# Patient Record
Sex: Male | Born: 1990 | Race: Black or African American | Hispanic: No | Marital: Single | Smoking: Never smoker
Health system: Southern US, Community
[De-identification: ages and names within clinical notes are randomized; demographics above are authoritative.]

## PROBLEM LIST (undated history)

## (undated) DIAGNOSIS — R569 Unspecified convulsions: Secondary | ICD-10-CM

## (undated) DIAGNOSIS — J45909 Unspecified asthma, uncomplicated: Secondary | ICD-10-CM

---
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
## 2015-07-04 ENCOUNTER — Emergency Department (HOSPITAL_COMMUNITY)
Admission: EM | Admit: 2015-07-04 | Discharge: 2015-07-04 | Disposition: A | Discharge status: Home / Self Care | Payer: Managed Care, Other (non HMO) | Attending: Emergency Medicine | Admitting: Emergency Medicine

## 2015-07-04 ENCOUNTER — Emergency Department (HOSPITAL_COMMUNITY): Payer: Managed Care, Other (non HMO)

## 2015-07-04 ENCOUNTER — Encounter (HOSPITAL_COMMUNITY): Payer: Self-pay | Admitting: Cardiology

## 2015-07-04 DIAGNOSIS — R079 Chest pain, unspecified: Secondary | ICD-10-CM | POA: Diagnosis present

## 2015-07-04 DIAGNOSIS — J45909 Unspecified asthma, uncomplicated: Secondary | ICD-10-CM | POA: Insufficient documentation

## 2015-07-04 HISTORY — DX: Unspecified asthma, uncomplicated: J45.909

## 2015-07-04 LAB — BASIC METABOLIC PANEL
Anion gap: 8 (ref 5–15)
BUN: 9 mg/dL (ref 6–20)
CALCIUM: 9.6 mg/dL (ref 8.9–10.3)
CO2: 25 mmol/L (ref 22–32)
CREATININE: 1.02 mg/dL (ref 0.61–1.24)
Chloride: 106 mmol/L (ref 101–111)
GFR calc Af Amer: >60 mL/min (ref >60)
GFR calc non Af Amer: >60 mL/min (ref >60)
GLUCOSE: 100 mg/dL — AB (ref 65–99)
Potassium: 4.3 mmol/L (ref 3.5–5.1)
Sodium: 139 mmol/L (ref 135–145)

## 2015-07-04 LAB — CBC
HCT: 46.3 % (ref 39.0–52.0)
HEMOGLOBIN: 15.6 g/dL (ref 13.0–17.0)
MCH: 29.2 pg (ref 26.0–34.0)
MCHC: 33.7 g/dL (ref 30.0–36.0)
MCV: 86.7 fL (ref 78.0–100.0)
PLATELETS: 195 10*3/uL (ref 150–400)
RBC: 5.34 MIL/uL (ref 4.22–5.81)
RDW: 13.2 % (ref 11.5–15.5)
WBC: 5.7 10*3/uL (ref 4.0–10.5)

## 2015-07-04 LAB — I-STAT TROPONIN, ED: TROPONIN I, POC: 0 ng/mL (ref 0.00–0.08)

## 2015-07-04 MED ORDER — ACETAMINOPHEN 325 MG PO TABS: 650.0000 mg | ORAL_TABLET | Freq: Once | ORAL | Status: DC

## 2015-07-04 MED ORDER — IBUPROFEN 800 MG PO TABS: 800.0000 mg | ORAL_TABLET | Freq: Once | ORAL | Status: DC

## 2015-07-04 MED ORDER — IBUPROFEN 800 MG PO TABS: 800.0000 mg | ORAL_TABLET | Freq: Three times a day (TID) | ORAL | Status: AC

## 2015-07-04 MED ORDER — IBUPROFEN 800 MG PO TABS
800.0000 mg | ORAL_TABLET | Freq: Once | ORAL | Status: AC
  Administered 2015-07-04: 800 mg via ORAL
  Filled 2015-07-04: qty 1

## 2015-07-04 NOTE — Discharge Instructions (Signed)
1. Medications: ibuprofen, usual home medications 2. Treatment: rest, drink plenty of fluids 3. Follow Up: please followup with your primary doctor for discussion of your diagnoses and further evaluation after today's visit; if you do not have a primary care doctor use the resource guide provided to find one; please return to the ER for severe pain, shortness of breath, new or worsening symptoms   Nonspecific Chest Pain It is often hard to find the cause of chest pain. There is always a chance that your pain could be related to something serious, such as a heart attack or a blood clot in your lungs. Chest pain can also be caused by conditions that are not life-threatening. If you have chest pain, it is very important to follow up with your doctor.  HOME CARE  If you were prescribed an antibiotic medicine, finish it all even if you start to feel better.  Avoid any activities that cause chest pain.  Do not use any tobacco products, including cigarettes, chewing tobacco, or electronic cigarettes. If you need help quitting, ask your doctor.  Do not drink alcohol.  Take medicines only as told by your doctor.  Keep all follow-up visits as told by your doctor. This is important. This includes any further testing if your chest pain does not go away.  Your doctor may tell you to keep your head raised (elevated) while you sleep.  Make lifestyle changes as told by your doctor. These may include:  Getting regular exercise. Ask your doctor to suggest some activities that are safe for you.  Eating a heart-healthy diet. Your doctor or a diet specialist (dietitian) can help you to learn healthy eating options.  Maintaining a healthy weight.  Managing diabetes, if necessary.  Reducing stress. GET HELP IF:  Your chest pain does not go away, even after treatment.  You have a rash with blisters on your chest.  You have a fever. GET HELP RIGHT AWAY IF:  Your chest pain is worse.  You have an  increasing cough, or you cough up blood.  You have severe belly (abdominal) pain.  You feel extremely weak.  You pass out (faint).  You have chills.  You have sudden, unexplained chest discomfort.  You have sudden, unexplained discomfort in your arms, back, neck, or jaw.  You have shortness of breath at any time.  You suddenly start to sweat, or your skin gets clammy.  You feel nauseous.  You vomit.  You suddenly feel light-headed or dizzy.  Your heart begins to beat quickly, or it feels like it is skipping beats. These symptoms may be an emergency. Do not wait to see if the symptoms will go away. Get medical help right away. Call your local emergency services (911 in the U.S[place] Do not drive yourself to the hospital.   This information is not intended to replace advice given to you by your health care provider. Make sure you discuss any questions you have with your health care provider.   Document Released: 02/13/2008 Document Revised: 09/17/2014 Document Reviewed: 04/02/2014 Elsevier Interactive Patient Education 2016 [employer]   Emergency Department Resource Guide 1) Find a Doctor and Pay Out of Pocket Although you won't have to find out who is covered by your insurance plan, it is a good idea to ask around and get recommendations. You will then need to call the office and see if the doctor you have chosen will accept you as a new patient and what types of options they offer  for patients who are self-pay. Some doctors offer discounts or will set up payment plans for their patients who do not have insurance, but you will need to ask so you aren't surprised when you get to your appointment.  2) Contact Your Local Health Department Not all health departments have doctors that can see patients for sick visits, but many do, so it is worth a call to see if yours does. If you don't know where your local health department is, you can check in your phone book. The CDC also has a  tool to help you locate your state's health department, and many state websites also have listings of all of their local health departments.  3) Find a [facility] If your illness is not likely to be very severe or complicated, you may want to try a walk in clinic. These are popping up all over the country in pharmacies, drugstores, and shopping centers. They're usually staffed by nurse practitioners or physician assistants that have been trained to treat common illnesses and complaints. They're usually fairly quick and inexpensive. However, if you have serious medical issues or chronic medical problems, these are probably not your best option.  No Primary Care Doctor: - Call Health Connect at  [phone] - they can help you locate a primary care doctor that  accepts your insurance, provides certain services, etc. - Physician Referral Service- [phone]  Chronic Pain Problems: Organization         Address  Phone   Notes  [facility]  [phone] Patients need to be referred by their primary care doctor.   Medication Assistance: Organization         Address  Phone   Notes  [facility] Medication [facility] [address] [place], [place] [address] [phone] --Must be a resident of [facility] -- Must have NO insurance coverage whatsoever (no Medicaid/ Medicare, etc.) -- The pt. MUST have a primary care doctor that directs their care regularly and follows them in the community   MedAssist  [phone]   [employer]  [phone]    Agencies that provide inexpensive medical care: Organization         Address  Phone   Notes  Reserve  [phone]   [facility]    [phone]   [facility] [place],  [phone]   [place] [address] [place] [phone]   Planned Parenthood    [phone]    [facility]    [phone]   [place] and [place] [address], [place] Phone:  [phone], Fax:  [phone] Hours of Operation:  9 am - 6 pm, M-F.  Also accepts Medicaid/Medicare and self-pay.  [facility] for [place], [address], [place] Phone: [phone], Fax: [phone]. Hours of Operation:  8:30 am - 5:30 pm, M-F.  Also accepts Medicaid and self-pay.  [facility] [place] [address], [place] Phone: [phone]   [place], [place], [place] [phone], Ext. [phone] Mondays & Thursdays: 7-9 AM.  First 15 patients are seen on a first come, first serve basis.    [place] Providers:  Organization         Address  Phone   Notes  [employer]  [address], [place] [phone] Also accepts self-pay patients.  [facility] [id number] [place], [place]  [phone]   [place], [address], [place] [phone]   [facility] [address], [place] [phone]   Lucianne Lei [address], [place]   [phone] Only accepts [place] Access [place] patients after they have their name applied to their card.   Self-Pay (no insurance) in [facility]:  Organization         Address  Phone   Notes  Sickle Cell Patients, [facility] [place] [phone]   [facility] [place] [phone]   [facility] [place]  [place], [place],  [phone]   [facility]/Dr. Osei-Bonsu  [address] [place] or [address], [place] [phone] Phone number for both [place] and [place] locations is the same.  [facility] [address] [place] [phone]   [facility] [address], [place] or [address] [phone]   [facility] [address], [place] [phone], phone; [phone], fax Sees patients 1st and 3rd Saturday of every month.  Must not qualify for public or private insurance (i.e. Medicaid, Medicare, [place] Health Choice, Veterans' Benefits)  Household income should be no more than 200% of the poverty level The clinic cannot treat you if you are pregnant or think you are pregnant  Sexually transmitted diseases are not treated at the clinic.    Dental Care: Organization         Address  Phone  Notes  [facility] [place] [phone] Accepts children up to age 62 who are enrolled in [place] or [place]; pregnant women with a Medicaid card; and children who have applied for Medicaid or [place] Health Choice, but were declined, whose parents can pay a reduced fee at time of service.  [facility] Department of [facility]  [address], [place] [phone] Accepts children up to age 102 who are enrolled in [place] or [place]; pregnant women with a Medicaid card; and children who have applied for Medicaid or [place] Health Choice, but were declined, whose parents can pay a reduced fee at time of service.  [place] [facility] PRO[facility]  [place] [phone] Patients are seen by appointment only. Walk-ins are not accepted. [place] will see patients 32 years of age and older. Monday - Tuesday (8am-5pm) Most Wednesdays (8:30-5pm) $30 per visit, cash only  [facility] Adult Dental Access PROGRAM  [address], [facility] [phone] Patients are seen by appointment only. Walk-ins are not accepted. [place] will see patients 29 years of age and older. One Wednesday  Evening (Monthly: Volunteer Based).  $30 per visit, cash only  [place]  [phone] for adults; Children under age 56, call Graduate Pediatric Dentistry at [phone]. Children aged 33-14, please call [phone] to request a pediatric application.  Dental services are provided in all areas of dental care including fillings, crowns and bridges, complete and partial dentures, implants, gum  treatment, root canals, and extractions. Preventive care is also provided. Treatment is provided to both adults and children. Patients are selected via a lottery and there is often a waiting list.   [facility] [address], [place]  [phone] [email or web address]   [facility] [address] [place], [place] [phone], Ext. [phone] Second and Fourth Thursday of each month, opens at 6:30 AM; Clinic ends at 9 AM.  Patients are seen on a first-come first-served basis, and a limited number are seen during each clinic.   [facility]  [address] Hillard Danker [place], [place] [phone]   Eligibility Requirements You must have lived in [place], [place], or [place] counties for at least the last three months.   You cannot be eligible for state or federal sponsored [employer], including [employer], [place], or [employer].   You generally cannot be eligible for healthcare insurance through your employer.    How to apply: Eligibility screenings are held every Tuesday and Wednesday afternoon from 1:00 pm until 4:00 pm. You do not need an appointment for the interview!  [facility] [address], [place], [place]   [place]  [place] Department  [place]  [phone]    Behavioral Health Resources in the Community: Intensive Outpatient Programs Organization         Address  Phone  Notes  [place]. [address] [place], [place] [phone]   [facility] Outpatient [address] [place], [place]   ADS: Alcohol [facility] Drug Svcs [address], [place], [place]   [place] [address],  [place], [place] or [phone]   Substance Abuse Resources Organization         Address  Phone  Notes  Alcohol and Drug Services  [phone]   [place]  [phone]   [place]   [place]inita Pester  [phone]   Residential & Outpatient Substance Abuse Program  [phone]   Psychological Services Organization         Address  Phone  Notes  [facility] [place]  [place]  [phone]   [place] [place] or [phone]    Mobile Crisis Teams Organization         Address  Phone  Notes  [facility], Mobile Crisis Care Unit  [phone]   [facility]  [address] [place], [place]   [place] [address] [place] [phone]    Self-Help/Support Groups Organization         Address  Phone             Notes  [place]. of [place] - variety of support groups  [place] Call for more information  Narcotics Anonymous (NA), [facility] [address], [employer] [place]  2 meetings at this location   Special educational needs teacher         Address  Phone  Notes  [facility] [place],    [place]  [place]  [address] [place], [place], [place]   [place], [place] [phone] Admissions: 8am-3pm M-F  [facility]  Center [address]    °[place], [place] [phone]   °[facility] [address],  [place], [place] [phone]   °[facility] [address]  °[place], [place] [phone]   °[facility] - Intensive Outpatient [address], [place], [place] [phone]   °[facility] [address]  °[place], [place] [phone] or [phone]   °[facility] ([facility]) [address] [place], [place] [phone] Accepts Medicaid  °[facility] [address]  °[place] [phone] Substance Abuse/Addiction Treatment  ° °[place] Behavioral Health Resources °Organization         Address  Phone  Notes  °[facility]  [phone]   °Julie Brannon, PhD [address] [place], [place]   ([phone]) [phone] or (336) [phone]   °[facility]   [address] °[place], [place] ([phone]) [phone]   °[facility] [address], [place], [place] [phone] Insurance/Medicaid/sponsorship through [facility]  °[facility] [address]                                    [place], [place] [phone] Therapy/tele-psych/case  °[facility] [address]  ° [place], [place] [phone]) [phone]    °Dr. Arfeen  ([phone]   °[facility] [place]  [employer] [place] [facility] 1) [address], [place] °2) [address], [place] °3)  [address], [place] [phone]) [phone] °([phone]) [phone] ° °([phone]) [phone]   °[place] Child Abuse Hotline (336) 342-1394 or (336) [phone] (After Hours)    ° ° ° °

## 2015-07-04 NOTE — ED Notes (Signed)
Reports left sided chest pain that started about a week ago. Reports he ate a spicy burger at [employer] then and has been having this pain since then.

## 2015-07-04 NOTE — ED Provider Notes (Signed)
CSN: [id number]     Arrival date & time 07/04/15  1030 History   First MD Initiated Contact with Patient 07/04/15 1144     Chief Complaint  Patient presents with  . Chest Pain     HPI   Kevin Osborne is a 24 y.o. male with a PMH of asthma who presents to the ED with chest pain, which has been present for approximately one week. He states his pain is left-sided and constant. He denies precipitating factors, including eating. He has tried over-the-counter pain medication and antacids for symptom relief, which have not been effective. He states he has been able to sleep at night, but has had to lie flat on his back, as lying on his left side exacerbates his pain. Denies fever, chills, cough, congestion, shortness of breath, abdominal pain, nausea, vomiting, diarrhea, constipation, dysuria, urgency, frequency, weakness, numbness, paresthesia.   Past Medical History  Diagnosis Date  . Asthma    History reviewed. No pertinent past surgical history. History reviewed. No pertinent family history. Social History  Substance Use Topics  . Smoking status: Never Smoker   . Smokeless tobacco: None  . Alcohol Use: No      Review of Systems  Constitutional: Negative for fever and chills.  HENT: Negative for congestion.   Respiratory: Negative for cough and shortness of breath.   Cardiovascular: Positive for chest pain.  Gastrointestinal: Negative for nausea, vomiting, abdominal pain, diarrhea and constipation.  Genitourinary: Negative for dysuria, urgency and frequency.  Neurological: Negative for dizziness, light-headedness and headaches.  All other systems reviewed and are negative.     Allergies  Review of patient's allergies indicates no known allergies.  Home Medications   Prior to Admission medications   Not on File    BP 150/74 mmHg  Pulse 69  Temp(Src) 98 F (36.7 C) (Oral)  Resp 17  Ht 5' 5.5" (1.664 m)  Wt 172 lb (78.019 kg)  BMI 28.18 kg/m2  SpO2 97% Physical  Exam  Constitutional: He is oriented to person, place, and time. He appears well-developed and well-nourished. No distress.  HENT:  Head: Normocephalic and atraumatic.  Right Ear: External ear normal.  Left Ear: External ear normal.  Nose: Nose normal.  Mouth/Throat: Uvula is midline, oropharynx is clear and moist and mucous membranes are normal.  Eyes: Conjunctivae, EOM and lids are normal. Pupils are equal, round, and reactive to light. Right eye exhibits no discharge. Left eye exhibits no discharge. No scleral icterus.  Neck: Normal range of motion. Neck supple.  Cardiovascular: Normal rate, regular rhythm, normal heart sounds, intact distal pulses and normal pulses.   Pulmonary/Chest: Effort normal and breath sounds normal. No respiratory distress. He has no wheezes. He has no rales. He exhibits no tenderness.  Abdominal: Soft. Normal appearance and bowel sounds are normal. He exhibits no distension and no mass. There is no tenderness. There is no rigidity, no rebound and no guarding.  Musculoskeletal: Normal range of motion. He exhibits no edema or tenderness.  Neurological: He is alert and oriented to person, place, and time.  Skin: Skin is warm, dry and intact. No rash noted. He is not diaphoretic. No erythema. No pallor.  Psychiatric: He has a normal mood and affect. His speech is normal and behavior is normal.  Nursing note and vitals reviewed.   ED Course  Procedures (including critical care time)  Labs Review Labs Reviewed  BASIC METABOLIC PANEL - Abnormal; Notable for the following:    Glucose, Bld  100 (*)    All other components within normal limits  CBC  I-STAT TROPOININ, ED    Imaging Review Dg Chest 2 View  07/04/2015  CLINICAL DATA:  Left chest pain for the past week. EXAM: CHEST  2 VIEW COMPARISON:  08/16/2008. FINDINGS: Normal sized heart. Clear lungs with normal vascularity. Minimal lower thoracic spine degenerative spur formation. IMPRESSION: No acute  abnormality. Electronically Signed   By: Beckie SaltsSteven  Reid M.D.   On: 07/04/2015 12:28     I have personally reviewed and evaluated these images and lab results as part of my medical decision-making.   EKG Interpretation   Date/Time:  Monday July 04 2015 10:40:01 EDT Ventricular Rate:  78 PR Interval:  130 QRS Duration: 96 QT Interval:  368 QTC Calculation: 419 R Axis:   113 Text Interpretation:  Normal sinus rhythm Right axis deviation Nonspecific  ST abnormality Abnormal ECG Sinus rhythm hypertrophic changes Borderline  ECG Confirmed by Gerhard MunchLOCKWOOD, ROBERT  MD ([id number]) on 07/04/2015 1:30:34 PM      MDM   Final diagnoses:  Chest pain, unspecified chest pain type    24 year old male presents with constant left-sided chest pain x 1 week.  Denies fever, chills, cough, congestion, shortness of breath, abdominal pain, nausea, vomiting, diarrhea, constipation, dysuria, urgency, frequency, weakness, numbness, paresthesia. Patient is afebrile. Vital signs stable. Heart regular rate and rhythm. Lungs clear to auscultation bilaterally. No wheezing. Abdomen soft, non-tender, non-distended. No lower extremity edema.  CBC negative for leukocytosis. BMP within normal limits. Chest x-ray negative for acute abnormality. EKG normal sinus rhythm, no acute ischemia. Troponin negative.  Patient denies personal history of hypertension or hyperlipidemia. States he does not use tobacco products or drugs. Denies family history of significant cardiac events. HEART score 0. Low suspicion for ACS. Patient denies recent travel or immobility, recent surgery, history of malignancy, history of DVT/PE. PERC negative. Doubt PE. Patient is well-appearing, feel he is stable for discharge at this time. Will discharge with ibuprofen. Patient to follow up with PCP. Return precautions discussed. Patient verbalizes his understanding and is in agreement with plan.  BP 123/76 mmHg  Pulse 69  Temp(Src) 98.9 F (37.2 C) (Oral)   Resp 17  Ht 5' 5.5" (1.664 m)  Wt 172 lb (78.019 kg)  BMI 28.18 kg/m2  SpO2 99%     Mady Gemmalizabeth C [place] Abee, PA-C 07/04/15 1554  Gerhard Munchobert Lockwood, MD 07/04/15 1601

## 2016-10-15 IMAGING — DX DG CHEST 2V
2 series · 2 of 2 positions shown · non-contrast
Comparison: 08/16/2008.

CLINICAL DATA: Left chest pain for the past week.

EXAM:
CHEST  2 VIEW

[w chest pa]
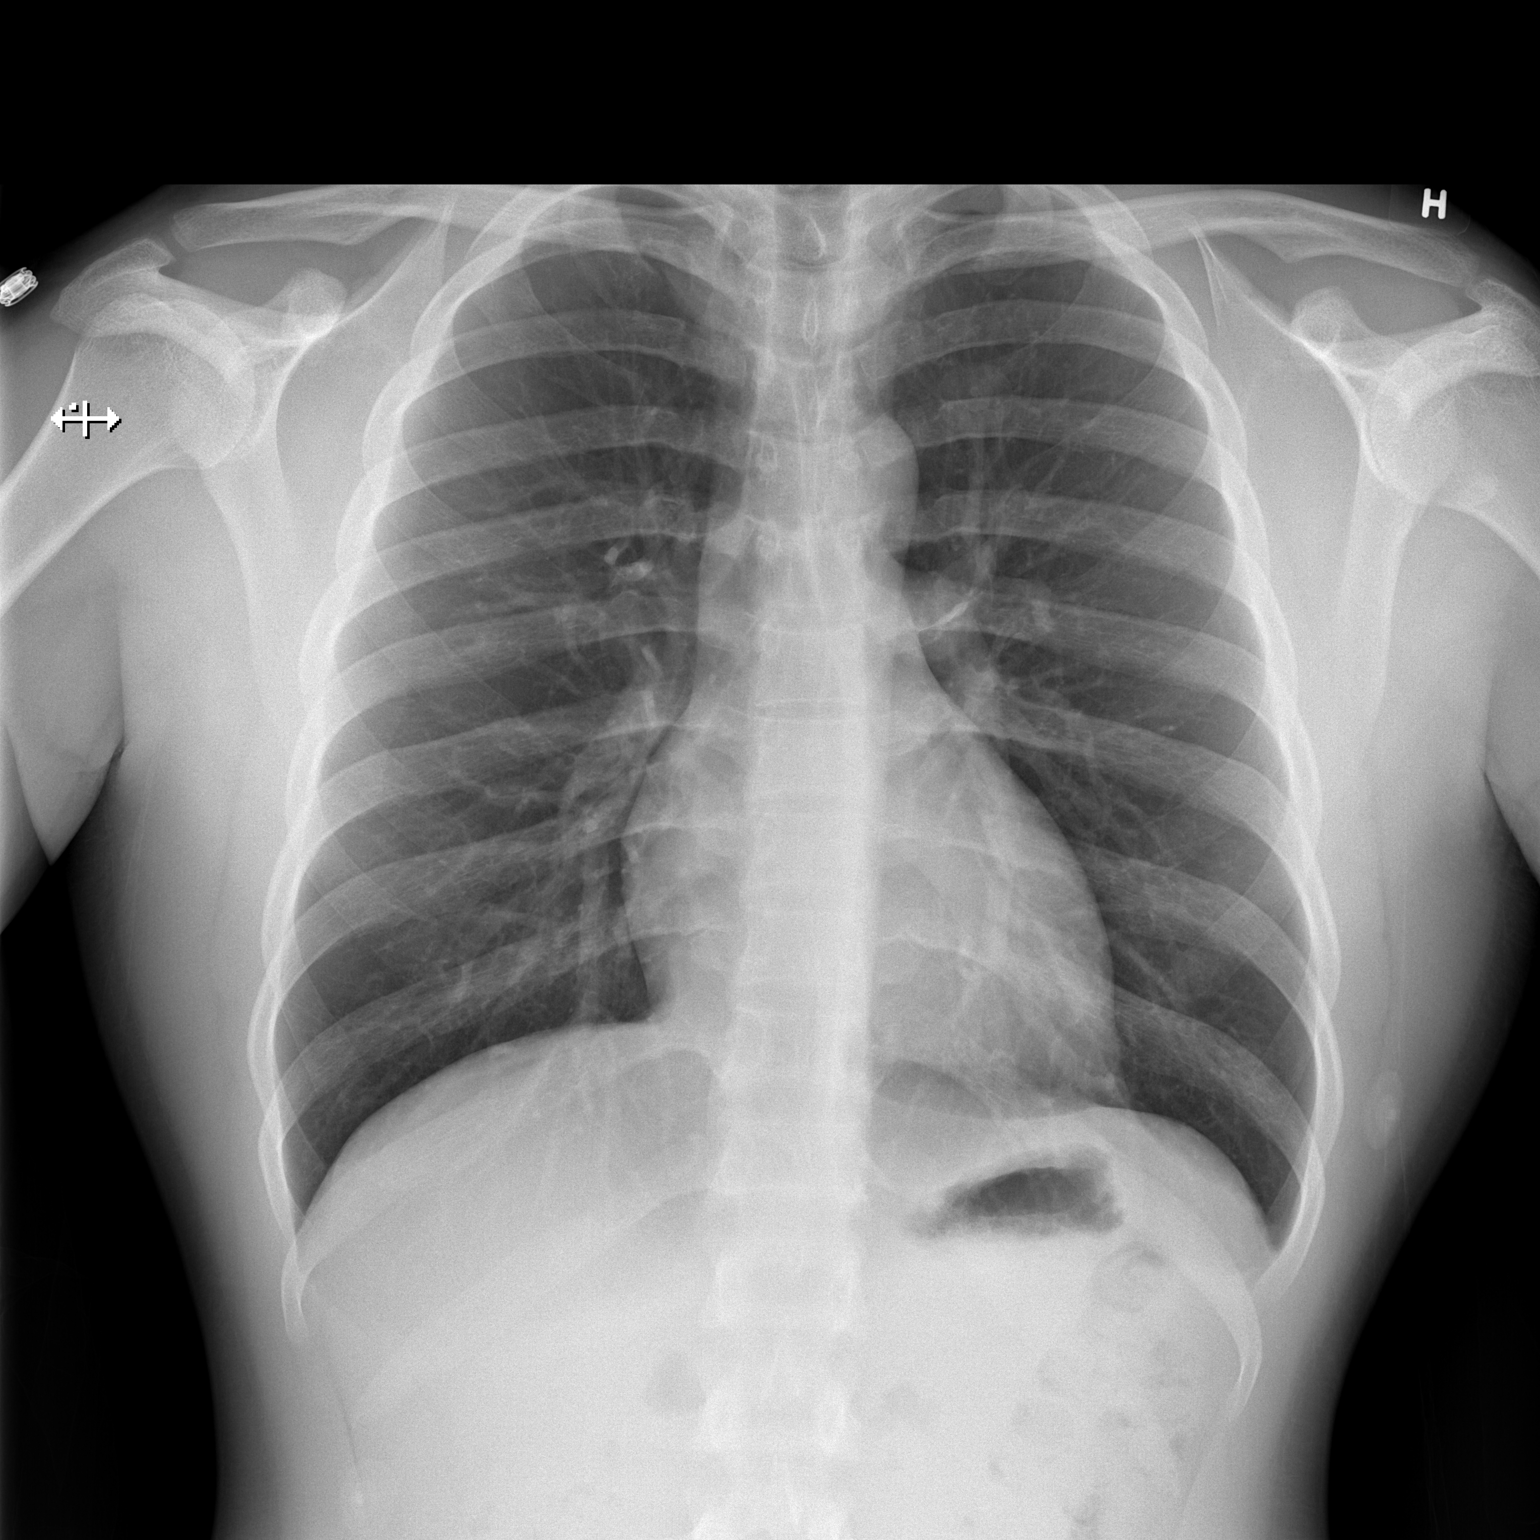

[w chest lat]
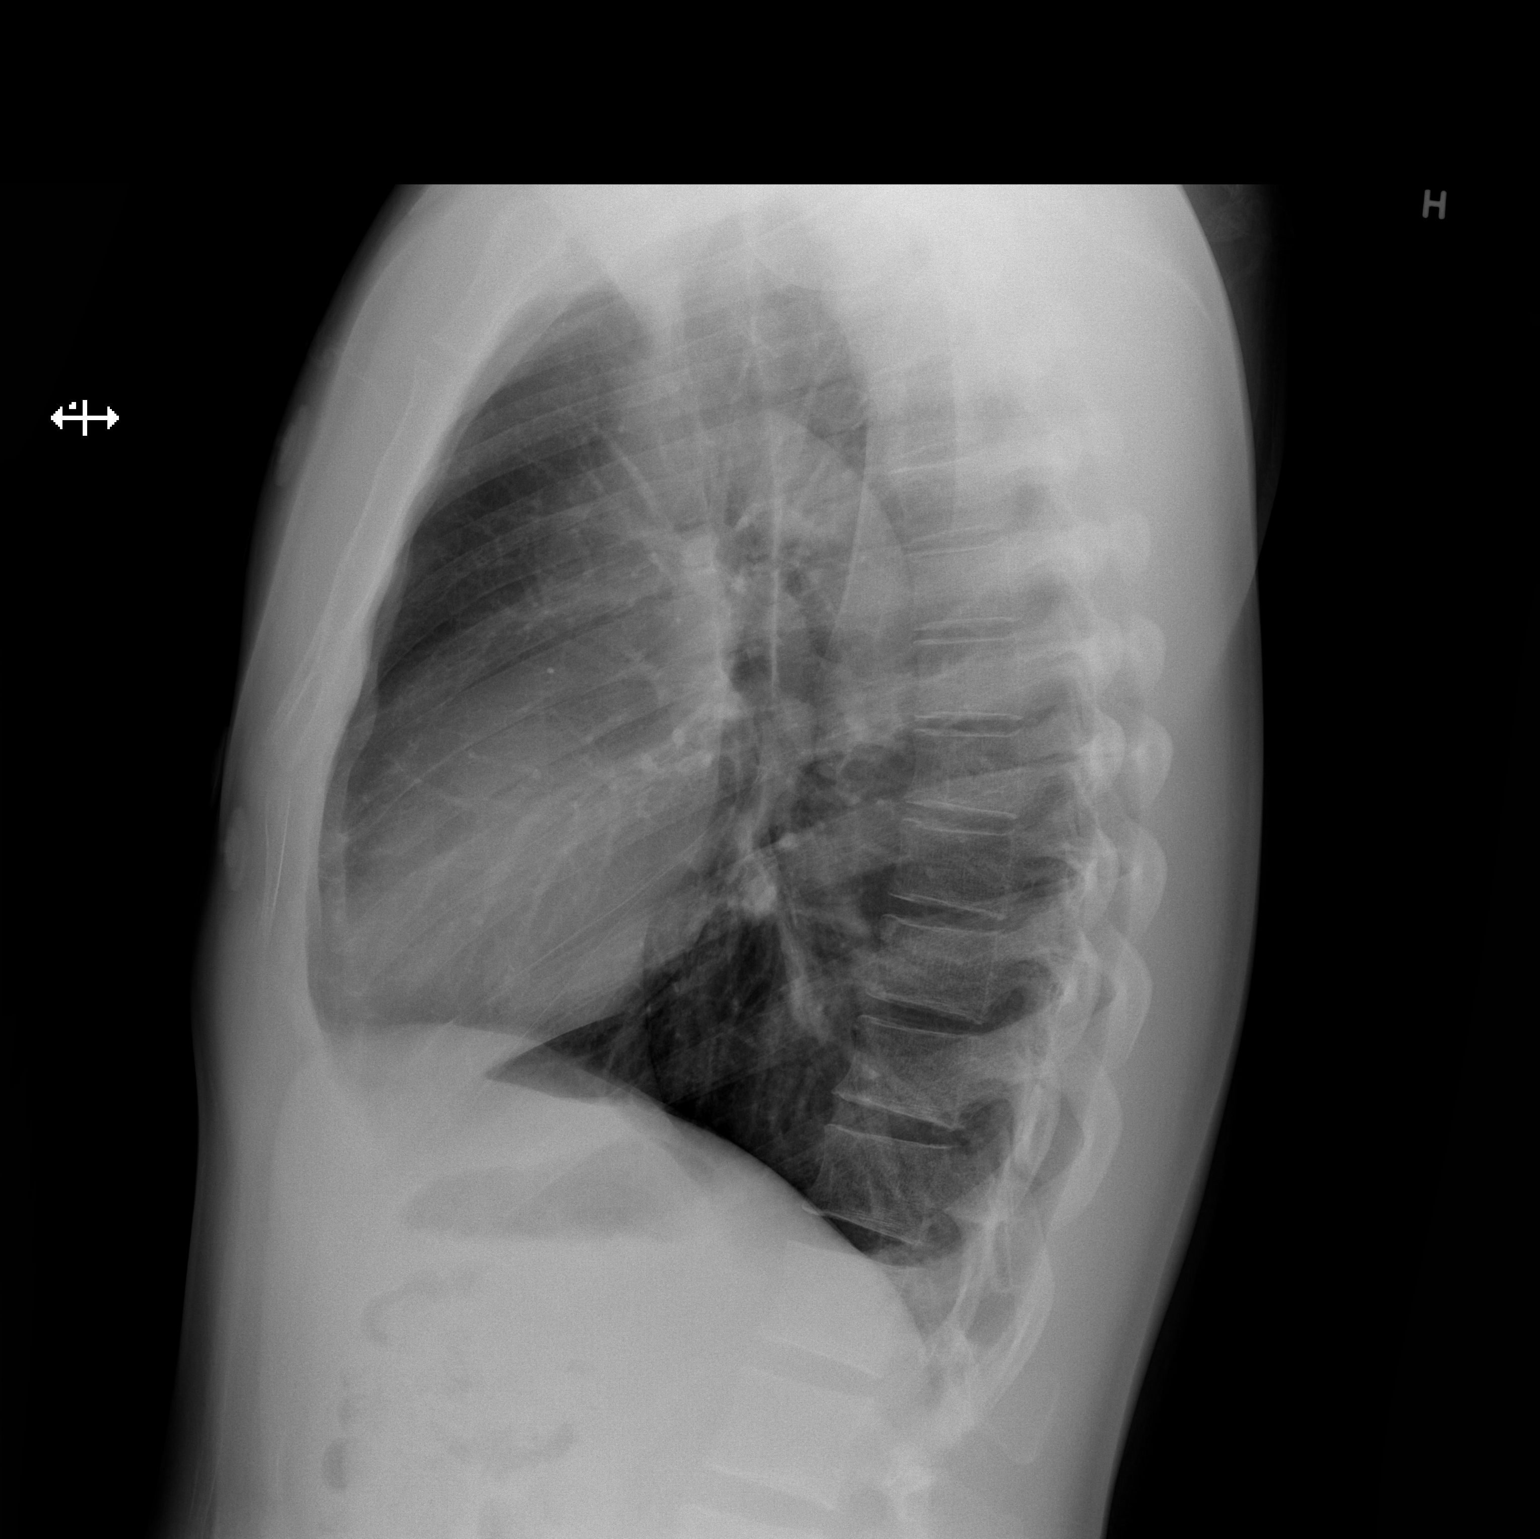

[2 of 2 positions shown; findings below may reference images not displayed]

FINDINGS: Normal sized heart. Clear lungs with normal vascularity. Minimal
lower thoracic spine degenerative spur formation.
IMPRESSION: No acute abnormality.

## 2024-01-26 ENCOUNTER — Other Ambulatory Visit: Payer: Self-pay

## 2024-01-26 ENCOUNTER — Emergency Department (HOSPITAL_COMMUNITY)
Admission: EM | Admit: 2024-01-26 | Discharge: 2024-01-26 | Disposition: A | Discharge status: Home / Self Care | Attending: Emergency Medicine | Admitting: Emergency Medicine

## 2024-01-26 ENCOUNTER — Encounter (HOSPITAL_COMMUNITY): Payer: Self-pay

## 2024-01-26 DIAGNOSIS — R7989 Other specified abnormal findings of blood chemistry: Secondary | ICD-10-CM | POA: Diagnosis not present

## 2024-01-26 DIAGNOSIS — R55 Syncope and collapse: Secondary | ICD-10-CM | POA: Diagnosis present

## 2024-01-26 HISTORY — DX: Unspecified convulsions: R56.9

## 2024-01-26 LAB — URINALYSIS, ROUTINE W REFLEX MICROSCOPIC
Bacteria, UA: NONE SEEN
Bilirubin Urine: NEGATIVE
Glucose, UA: 50 mg/dL — AB
Hgb urine dipstick: NEGATIVE
Ketones, ur: NEGATIVE mg/dL
Leukocytes,Ua: NEGATIVE
Nitrite: NEGATIVE
Protein, ur: NEGATIVE mg/dL
Specific Gravity, Urine: 1.015 (ref 1.005–1.030)
pH: 5 (ref 5.0–8.0)

## 2024-01-26 LAB — COMPREHENSIVE METABOLIC PANEL WITH GFR
ALT: 17 U/L (ref 0–44)
AST: 23 U/L (ref 15–41)
Albumin: 4.4 g/dL (ref 3.5–5.0)
Alkaline Phosphatase: 42 U/L (ref 38–126)
Anion gap: 11 (ref 5–15)
BUN: 26 mg/dL — ABNORMAL HIGH (ref 6–20)
CO2: 23 mmol/L (ref 22–32)
Calcium: 9.4 mg/dL (ref 8.9–10.3)
Chloride: 98 mmol/L (ref 98–111)
Creatinine, Ser: 1.65 mg/dL — ABNORMAL HIGH (ref 0.61–1.24)
GFR, Estimated: 57 mL/min — ABNORMAL LOW (ref >60)
Glucose, Bld: 147 mg/dL — ABNORMAL HIGH (ref 70–99)
Potassium: 3.6 mmol/L (ref 3.5–5.1)
Sodium: 132 mmol/L — ABNORMAL LOW (ref 135–145)
Total Bilirubin: 1.2 mg/dL (ref 0.0–1.2)
Total Protein: 7.9 g/dL (ref 6.5–8.1)

## 2024-01-26 LAB — TROPONIN I (HIGH SENSITIVITY)
Troponin I (High Sensitivity): 34 ng/L — ABNORMAL HIGH (ref <18)
Troponin I (High Sensitivity): 32 ng/L — ABNORMAL HIGH (ref <18)

## 2024-01-26 LAB — RAPID URINE DRUG SCREEN, HOSP PERFORMED
Amphetamines: NOT DETECTED
Barbiturates: NOT DETECTED
Benzodiazepines: NOT DETECTED
Cocaine: NOT DETECTED
Opiates: NOT DETECTED
Tetrahydrocannabinol: NOT DETECTED

## 2024-01-26 LAB — CBC WITH DIFFERENTIAL/PLATELET
Abs Immature Granulocytes: 0.01 10*3/uL (ref 0.00–0.07)
Basophils Absolute: 0.1 10*3/uL (ref 0.0–0.1)
Basophils Relative: 1 %
Eosinophils Absolute: 0.1 10*3/uL (ref 0.0–0.5)
Eosinophils Relative: 1 %
HCT: 47.8 % (ref 39.0–52.0)
Hemoglobin: 15.6 g/dL (ref 13.0–17.0)
Immature Granulocytes: 0 %
Lymphocytes Relative: 20 %
Lymphs Abs: 1.6 10*3/uL (ref 0.7–4.0)
MCH: 28.9 pg (ref 26.0–34.0)
MCHC: 32.6 g/dL (ref 30.0–36.0)
MCV: 88.5 fL (ref 80.0–100.0)
Monocytes Absolute: 0.5 10*3/uL (ref 0.1–1.0)
Monocytes Relative: 7 %
Neutro Abs: 5.6 10*3/uL (ref 1.7–7.7)
Neutrophils Relative %: 71 %
Platelets: 219 10*3/uL (ref 150–400)
RBC: 5.4 MIL/uL (ref 4.22–5.81)
RDW: 12.2 % (ref 11.5–15.5)
WBC: 7.8 10*3/uL (ref 4.0–10.5)
nRBC: 0 % (ref 0.0–0.2)

## 2024-01-26 LAB — MAGNESIUM: Magnesium: 2.2 mg/dL (ref 1.7–2.4)

## 2024-01-26 LAB — CK: Total CK: 384 U/L (ref 49–397)

## 2024-01-26 MED ORDER — SODIUM CHLORIDE 0.9 % IV BOLUS
1000.0000 mL | Freq: Once | INTRAVENOUS | Status: AC
  Administered 2024-01-26: 1000 mL via INTRAVENOUS

## 2024-01-26 NOTE — ED Provider Notes (Addendum)
 [place] [facility] AT [facility] Provider Note   CSN: [id number] Arrival date & time: 01/26/24  1645     History Chief Complaint  Patient presents with   Near Syncope    Kevin Osborne is a 33 y.o. male self reportedly otherwise healthy presents to the ER today for evaluation of near syncopal event. The patient reports that he was at brunch around 1500/1600 earlier today.  He reports that he had had some soda and a burger and fries and just finished his meal when he started to feel this overall lightheadedness with diaphoresis.  Did not have any chest pain, shortness breath, palpitations, headache, visual changes, trouble walking, or trouble talking.  Reports that his aunt had walked into a cooler part of the restaurant for him to cool down.  He reports that he went to the bathroom and had a normal bowel movement that was not melanotic or bloody.  Denies diarrhea.  Reports that he did feel better after that.  Walked back to the table but then started to have diffuse body cramps in his legs, gluteus/hips, arms. He does take workout supplements such as creatine and preworkout, none today.  He did play basketball earlier today.  He says that he does usually drink water however has not had any today.  His last workout was on Friday without any new exercises.  He denies having any chest pain, shortness of breath, palpitations, nausea, vomiting, headache, vision changes, numbness, tingling, unilateral weakness during this entirety of the event and afterwards.  Feels at his baseline health now.  No known drug allergies.  Denies any tobacco, EtOH, drug use.  The patient reports that he does have a distant history of childhood asthma and nonepileptic seizures, but has not been on any medications since 2007 and has not had any issues since then.    Near Syncope Pertinent negatives include no chest pain, no abdominal pain, no headaches and no shortness of breath.       Home  Medications Prior to Admission medications   Medication Sig Start Date End Date Taking? Authorizing Provider  albuterol (PROVENTIL HFA;VENTOLIN HFA) 108 (90 BASE) MCG/ACT inhaler Inhale 2 puffs into the lungs every 6 (six) hours as needed for wheezing or shortness of breath.    [provider]  calcium carbonate (TUMS - DOSED IN MG ELEMENTAL CALCIUM) 500 MG chewable tablet Chew 4 tablets by mouth daily.    [provider]  ibuprofen  (ADVIL ,MOTRIN ) 800 MG tablet Take 1 tablet (800 mg total) by mouth 3 (three) times daily. 07/04/15   Kriss Peter, PA-C  Multiple Vitamin (MULTIVITAMIN WITH MINERALS) TABS tablet Take 1 tablet by mouth daily.    [provider]      Allergies    Patient has no known allergies.    Review of Systems   Review of Systems  Constitutional:  Negative for chills and fever.  HENT:  Negative for congestion and rhinorrhea.   Eyes:  Negative for photophobia and visual disturbance.  Respiratory:  Negative for cough and shortness of breath.   Cardiovascular:  Positive for near-syncope. Negative for chest pain and palpitations.  Gastrointestinal:  Negative for abdominal pain, constipation, diarrhea, nausea and vomiting.  Musculoskeletal:  Positive for myalgias.  Neurological:  Positive for light-headedness. Negative for dizziness, syncope, speech difficulty, weakness and headaches.    Physical Exam Updated Vital Signs BP 132/75   Pulse 81   Temp 97.8 F (36.6 C) (Oral)   Resp  16   SpO2 98%  Physical Exam Vitals and nursing note reviewed.  Constitutional:      General: He is not in acute distress.    Appearance: He is not ill-appearing or toxic-appearing.  HENT:     Mouth/Throat:     Mouth: Mucous membranes are dry.  Eyes:     General: No scleral icterus. Cardiovascular:     Rate and Rhythm: Normal rate.     Pulses:          Radial pulses are 2+ on the right side and 2+ on the left side.       Dorsalis pedis pulses are 2+  on the right side and 2+ on the left side.       Posterior tibial pulses are 2+ on the right side and 2+ on the left side.  Pulmonary:     Effort: Pulmonary effort is normal. No respiratory distress.  Abdominal:     Palpations: Abdomen is soft.     Tenderness: There is no abdominal tenderness. There is no guarding or rebound.  Musculoskeletal:        General: No tenderness.     Right lower leg: No edema.     Left lower leg: No edema.     Comments: Compartments are soft throughout.  No specific tenderness to palpation.  Neurovascular intact distally in all 4 extremities.  Coloration, temperature, and size appear and feel symmetric bilaterally in the upper and lower extremities.  Skin:    General: Skin is warm and dry.  Neurological:     General: No focal deficit present.     Mental Status: He is alert.     GCS: GCS eye subscore is 4. GCS verbal subscore is 5. GCS motor subscore is 6.     Cranial Nerves: No cranial nerve deficit, dysarthria or facial asymmetry.     Sensory: No sensory deficit.     Motor: No weakness or pronator drift.     Coordination: Finger-Nose-Finger Test normal.     ED Results / Procedures / Treatments   Labs (all labs ordered are listed, but only abnormal results are displayed) Labs Reviewed  COMPREHENSIVE METABOLIC PANEL WITH GFR - Abnormal; Notable for the following components:      Result Value   Sodium 132 (*)    Glucose, Bld 147 (*)    BUN 26 (*)    Creatinine, Ser 1.65 (*)    GFR, Estimated 57 (*)    All other components within normal limits  TROPONIN I (HIGH SENSITIVITY) - Abnormal; Notable for the following components:   Troponin I (High Sensitivity) 34 (*)    All other components within normal limits  CBC WITH DIFFERENTIAL/PLATELET  MAGNESIUM  CK  URINALYSIS, ROUTINE W REFLEX MICROSCOPIC  RAPID URINE DRUG SCREEN, HOSP PERFORMED  TROPONIN I (HIGH SENSITIVITY)    EKG EKG Interpretation Date/Time:  Sunday Jan 26 2024 18:02:41  EDT Ventricular Rate:  87 PR Interval:  143 QRS Duration:  84 QT Interval:  347 QTC Calculation: 418 R Axis:   49  Text Interpretation: Sinus rhythm Confirmed by Dorenda Gandy [id number]) on 01/26/2024 6:39:09 PM  Radiology No results found.  Procedures Procedures   Medications Ordered in ED Medications  sodium chloride 0.9 % bolus 1,000 mL (has no administration in time range)  sodium chloride 0.9 % bolus 1,000 mL (1,000 mLs Intravenous New Bag/Given 01/26/24 1903)    ED Course/ Medical Decision Making/ A&P    Medical Decision Making Amount and/or  Complexity of Data Reviewed Labs: ordered.   33 y.o. male presents to the ER for evaluation of near syncopal event. Differential diagnosis includes but is not limited to CVA, ACS, arrhythmia, vasovagal / orthostatic hypotension, sepsis, hypoglycemia, electrolyte disturbance, respiratory failure, anemia, dehydration, heat injury, polypharmacy, malignancy, anxiety/panic attack. Vital signs unremarkable. Physical exam as noted above.   I independently reviewed and interpreted the patient's labs. Magnesium at 2.2. CK at 384.CMP shows AKI with creatinine at 1.65 and BUN at 26. Glucose 147 and sodium at 132. CBC without leukocytosis or anemia. Troponin at 34, repeat pending.   Concerned for elevated troponin. My attending is aware. He is still receiving IVF. Urinalysis in process. Will need delta troponins. Denies known early cardiac deaths <40 y/o in the family.   EKG reviewed and interpreted by my attending and read as sinus rhythm.   8:30 PM Care of MARQUAVION VENHUIZEN  transferred to [facility] at the end of my shift as the patient will require reassessment once labs/imaging have resulted. Patient presentation, ED course, and plan of care discussed with review of all pertinent labs and imaging. Please see his/her note for further details regarding further ED course and disposition. Plan at time of handoff is follow up second troponin,  consult cardiology, may need additional fluids. If cleared for discharge, would consult cardiology. This may be altered or completely changed at the discretion of the oncoming team pending results of further workup.  Portions of this report may have been transcribed using voice recognition software. Every effort was made to ensure accuracy; however, inadvertent computerized transcription errors may be present.    Final Clinical Impression(s) / ED Diagnoses Final diagnoses:  None    Rx / DC Orders ED Discharge Orders     None         Spence Dux, PA-C 01/26/24 2033    Spence Dux, PA-C 01/26/24 2108    Dorenda Gandy, MD 01/26/24 2244

## 2024-01-26 NOTE — Discharge Instructions (Signed)
 You were seen in the ER today for concerns of near syncope. Your labs and imaging were thankfully reassuring although your troponin level was elevated but this is not clear as to why. I spoke with our cardiologist regarding this and he does not feel that you need admission to the hospital at this time but should follow up with a cardiologist. For any concerns of new or worsening symptoms, return to the ER. Otherwise, follow up with the cardiologist and your primary care provider.

## 2024-01-26 NOTE — ED Notes (Signed)
 Messaged provider about patient's lab work (BUN & Creat. )

## 2024-01-26 NOTE — ED Triage Notes (Signed)
 Pt states he he had just finished eating a meal and suddenly began to feel bad like he was going to pass out. Family took pt to the bathroom and pt had a bowel movement and felt better after that, soon after that pt states he began to cramp all over where he couldn't even walk without support. Pt states he is still cramping at this moment just not as bad. Pt denies any chest pain or sob.

## 2024-01-26 NOTE — ED Provider Notes (Signed)
 Accepted handoff at shift change from [place], PA-C. Please see prior provider note for more detail.   Briefly: Patient is 33 y.o.   DDX: concern for ACS, PE, pneumonia, vasovagal syncope  Plan: disposition per repeat troponin and symptomatic improvement/stabilization  Physical Exam  BP 118/63   Pulse 67   Temp 98.3 F (36.8 C) (Oral)   Resp 17   SpO2 96%   Physical Exam Vitals and nursing note reviewed.  Constitutional:      General: He is not in acute distress.    Appearance: He is not ill-appearing or toxic-appearing.  HENT:     Mouth/Throat:     Mouth: Mucous membranes are dry.  Eyes:     General: No scleral icterus. Cardiovascular:     Rate and Rhythm: Normal rate.     Pulses:          Radial pulses are 2+ on the right side and 2+ on the left side.       Dorsalis pedis pulses are 2+ on the right side and 2+ on the left side.       Posterior tibial pulses are 2+ on the right side and 2+ on the left side.  Pulmonary:     Effort: Pulmonary effort is normal. No respiratory distress.  Abdominal:     Palpations: Abdomen is soft.     Tenderness: There is no abdominal tenderness. There is no guarding or rebound.  Musculoskeletal:        General: No tenderness.     Right lower leg: No edema.     Left lower leg: No edema.     Comments: Compartments are soft throughout.  No specific tenderness to palpation.  Neurovascular intact distally in all 4 extremities.  Coloration, temperature, and size appear and feel symmetric bilaterally in the upper and lower extremities.  Skin:    General: Skin is warm and dry.  Neurological:     General: No focal deficit present.     Mental Status: He is alert.     GCS: GCS eye subscore is 4. GCS verbal subscore is 5. GCS motor subscore is 6.     Cranial Nerves: No cranial nerve deficit, dysarthria or facial asymmetry.     Sensory: No sensory deficit.     Motor: No weakness or pronator drift.     Coordination: Finger-Nose-Finger Test normal.      Procedures  Procedures  ED Course / MDM    Medical Decision Making Amount and/or Complexity of Data Reviewed Labs: ordered.   Repeat troponin downtrending to 32 compared to initial at 34. Will consult cardiology.  Spoke with Dr. Leandrew Proctor, cardiology fellow, who reviewed patient chart and has no specific concerns that would require admission for patient at this time. Did advise that there may have been a potential arrhythmia that occurred during the near syncopal episodes that caused patient to have troponin elevations.  Informed patient of recommendations from cardiology and he is agreeable with discharge home and outpatient cardiology follow up. Has remained stable otherwise with no significant changes while in the ED. Discharged home in stable condition.       Yesly Gerety A, PA-C 01/26/24 2257    Dalene Duck, MD 01/26/24 [phone]

## 2024-02-20 DIAGNOSIS — R55 Syncope and collapse: Secondary | ICD-10-CM | POA: Insufficient documentation

## 2024-02-20 NOTE — Progress Notes (Signed)
 Cardiology Office Note   Date:  02/21/2024   ID:  Kevin Osborne, DOB 23-Mar-1991, MRN [id number]  PCP:  Patient, No Pcp Per  Cardiologist:   None Referring:  ED  No chief complaint on file.     History of Present Illness: Kevin Osborne is a 33 y.o. male who presents for evaluation of near syncope.  He has no past cardiac history.  He presented to the emergency room and I reviewed these records.  This was May 18.  He had near syncope.  He had played for vigorous games of basketball.  He then went out to dinner with his family.  He developed severe cramping in his leg and his side.  He had gotten some diaphoresis.  He had presyncope.   He did not actually lose consciousness.  In the emergency room he was found to have troponins that were minimally elevated but flat.  His creatinine was 1.65 having previously been 1.02.  He was slightly hyponatremic.  EKG was unremarkable.  He was given some hydration.  He had been using creatine and also using something called Preworkout from [employer].  I looked this up and there was caffeine and creatine in this as well.  He usually exercises very vigorously and he does not have chest pressure, neck or arm discomfort.  He has never had shortness of breath, PND or orthopnea.  He has not had any palpitations, presyncope or syncope.  He has had no prior cardiac workup or testing.   Past Medical History:  Diagnosis Date   Asthma    Seizures (HCC)     History reviewed. No pertinent surgical history.   Current Outpatient Medications  Medication Sig Dispense Refill   albuterol (PROVENTIL HFA;VENTOLIN HFA) 108 (90 BASE) MCG/ACT inhaler Inhale 2 puffs into the lungs every 6 (six) hours as needed for wheezing or shortness of breath.     ibuprofen  (ADVIL ,MOTRIN ) 800 MG tablet Take 1 tablet (800 mg total) by mouth 3 (three) times daily. 21 tablet 0   calcium carbonate (TUMS - DOSED IN MG ELEMENTAL CALCIUM) 500 MG chewable tablet Chew 4 tablets by mouth  daily. (Patient not taking: Reported on 02/21/2024)     Multiple Vitamin (MULTIVITAMIN WITH MINERALS) TABS tablet Take 1 tablet by mouth daily. (Patient not taking: Reported on 02/21/2024)     No current facility-administered medications for this visit.    Allergies:   Patient has no known allergies.    Social History:  The patient  reports that he has never smoked. He does not have any smokeless tobacco history on file. He reports that he does not drink alcohol and does not use drugs.   Family History:  The patient's family history is not on file.    ROS:  Please see the history of present illness.   Otherwise, review of systems are positive for none.   All other systems are reviewed and negative.    PHYSICAL EXAM: VS:  BP 120/62 (BP Location: Left Arm, Patient Position: Sitting, Cuff Size: Normal)   Pulse 75   Ht 5' 6 (1.676 m)   Wt 207 lb 12.8 oz (94.3 kg)   SpO2 97%   BMI 33.54 kg/m  , BMI Body mass index is 33.54 kg/m. GENERAL:  Well appearing HEENT:  Pupils equal round and reactive, fundi not visualized, oral mucosa unremarkable NECK:  No jugular venous distention, waveform within normal limits, carotid upstroke brisk and symmetric, no bruits, no thyromegaly LYMPHATICS:  No cervical, inguinal adenopathy LUNGS:  Clear to auscultation bilaterally BACK:  No CVA tenderness CHEST:  Unremarkable HEART:  PMI not displaced or sustained,S1 and S2 within normal limits, no S3, no S4, no clicks, no rubs, no murmurs ABD:  Flat, positive bowel sounds normal in frequency in pitch, no bruits, no rebound, no guarding, no midline pulsatile mass, no hepatomegaly, no splenomegaly EXT:  2 plus pulses throughout, no edema, no cyanosis no clubbing SKIN:  No rashes no nodules NEURO:  Cranial nerves II through XII grossly intact, motor grossly intact throughout PSYCH:  Cognitively intact, oriented to person place and time    EKG:    Sinus rhythm, rate 87, axis within normal limits, intervals  within normal limits, no acute ST-T wave changes.  01/26/2024    Recent Labs: 01/26/2024: ALT 17; BUN 26; Creatinine, Ser 1.65; Hemoglobin 15.6; Magnesium 2.2; Platelets 219; Potassium 3.6; Sodium 132    Lipid Panel No results found for: CHOL, TRIG, HDL, CHOLHDL, VLDL, LDLCALC, LDLDIRECT    Wt Readings from Last 3 Encounters:  02/21/24 207 lb 12.8 oz (94.3 kg)  07/04/15 172 lb (78 kg)      Other studies Reviewed: Additional studies/ records that were reviewed today include: ED records. Review of the above records demonstrates:  Please see elsewhere in the note.     ASSESSMENT AND PLAN:  Presyncope: I suspect this may have been vagal and also related to dehydration.  His labs suggested significant dehydration which would have been related to his excessive exercise and supplements as described above.  I am going to repeat a basic metabolic profile.  He was not orthostatic in the office today.  He had no other symptoms.  In the absence of this no further cardiac workup is suggested.  Elevated troponin: I suggest this is probably related to dehydration and stress-induced.  He has normal EKG and no symptoms.  No further workup is suggested unless he has recurrent symptoms.  He and I had a long discussion about this.   Current medicines are reviewed at length with the patient today.  The patient does not have concerns regarding medicines.  The following changes have been made:  no change  Labs/ tests ordered today include:  No orders of the defined types were placed in this encounter.    Disposition:   FU with with me as needed.     Signed, Eilleen Grates, MD  02/21/2024 8:23 AM    [facility]

## 2024-02-21 ENCOUNTER — Ambulatory Visit: Attending: Cardiology | Admitting: Cardiology

## 2024-02-21 ENCOUNTER — Encounter: Payer: Self-pay | Admitting: Cardiology

## 2024-02-21 VITALS — BP 120/62 | HR 75 | Ht 66.0 in | Wt 207.8 lb

## 2024-02-21 DIAGNOSIS — R7989 Other specified abnormal findings of blood chemistry: Secondary | ICD-10-CM | POA: Diagnosis not present

## 2024-02-21 DIAGNOSIS — R55 Syncope and collapse: Secondary | ICD-10-CM | POA: Diagnosis not present

## 2024-02-21 LAB — BASIC METABOLIC PANEL WITH GFR
BUN/Creatinine Ratio: 11 (ref 9–20)
BUN: 15 mg/dL (ref 6–20)
CO2: 25 mmol/L (ref 20–29)
Calcium: 9.6 mg/dL (ref 8.7–10.2)
Chloride: 99 mmol/L (ref 96–106)
Creatinine, Ser: 1.4 mg/dL — ABNORMAL HIGH (ref 0.76–1.27)
Glucose: 86 mg/dL (ref 70–99)
Potassium: 5 mmol/L (ref 3.5–5.2)
Sodium: 140 mmol/L (ref 134–144)
eGFR: 69 mL/min/{1.73_m2} (ref >59)

## 2024-02-21 NOTE — Patient Instructions (Addendum)
 Medication Instructions:  Your physician recommends that you continue on your current medications as directed. Please refer to the Current Medication list given to you today.  *If you need a refill on your cardiac medications before your next appointment, please call your pharmacy*  Lab Work: BMET today If you have labs (blood work) drawn today and your tests are completely normal, you will receive your results only by: MyChart Message (if you have MyChart) OR A paper copy in the mail If you have any lab test that is abnormal or we need to change your treatment, we will call you to review the results.  Testing/Procedures: NONE  Follow-Up: At [facility], you and your health needs are our priority.  As part of our continuing mission to provide you with exceptional heart care, our providers are all part of one team.  This team includes your primary Cardiologist (physician) and Advanced Practice Providers or APPs (Physician Assistants and Nurse Practitioners) who all work together to provide you with the care you need, when you need it.  Your next appointment:   As needed     Provider:   Lavonne Prairie, MD   We recommend signing up for the patient portal called MyChart.  Sign up information is provided on this After Visit Summary.  MyChart is used to connect with patients for Virtual Visits (Telemedicine).  Patients are able to view lab/test results, encounter notes, upcoming appointments, etc.  Non-urgent messages can be sent to your provider as well.   To learn more about what you can do with MyChart, go to [email or web address].   Other Instructions

## 2024-02-22 ENCOUNTER — Ambulatory Visit: Payer: Self-pay | Admitting: Cardiology

## 2024-02-22 DIAGNOSIS — R7989 Other specified abnormal findings of blood chemistry: Secondary | ICD-10-CM

## 2024-02-27 NOTE — Telephone Encounter (Signed)
-----   Message from Eilleen Grates sent at 02/22/2024  3:13 PM EDT ----- Creat is still elevated.  Please ask him to hydrate and get a UA (please order)  Repeat a BMET in one month.  Call Mr. Whitwell with the results ----- Message ----- From: Interface, [facility] Lab Results In Sent: 02/21/2024   8:40 PM EDT To: Eilleen Grates, MD

## 2024-02-27 NOTE — Telephone Encounter (Signed)
 Spoke with pt regarding his results. Pt aware of UA to be done and BMET in 1 month. Both ordered and released. Pt verbalized understanding. All questions if any were answered.
